# Patient Record
Sex: Male | Born: 1995 | Race: Black or African American | Hispanic: No | Marital: Single | Smoking: Never smoker
Health system: Southern US, Community
[De-identification: ages and names within clinical notes are randomized; demographics above are authoritative.]

## PROBLEM LIST (undated history)

## (undated) ENCOUNTER — Emergency Department (HOSPITAL_BASED_OUTPATIENT_CLINIC_OR_DEPARTMENT_OTHER): Admission: EM | Payer: Medicaid Other | Source: Home / Self Care

## (undated) DIAGNOSIS — J45909 Unspecified asthma, uncomplicated: Secondary | ICD-10-CM

---
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
## 2016-03-02 ENCOUNTER — Emergency Department (HOSPITAL_COMMUNITY)
Admission: EM | Admit: 2016-03-02 | Discharge: 2016-03-02 | Disposition: A | Discharge status: Home / Self Care | Payer: Medicaid Other | Attending: Emergency Medicine | Admitting: Emergency Medicine

## 2016-03-02 ENCOUNTER — Encounter (HOSPITAL_COMMUNITY): Payer: Self-pay | Admitting: Emergency Medicine

## 2016-03-02 DIAGNOSIS — Y939 Activity, unspecified: Secondary | ICD-10-CM | POA: Insufficient documentation

## 2016-03-02 DIAGNOSIS — Y999 Unspecified external cause status: Secondary | ICD-10-CM | POA: Insufficient documentation

## 2016-03-02 DIAGNOSIS — S134XXA Sprain of ligaments of cervical spine, initial encounter: Secondary | ICD-10-CM

## 2016-03-02 DIAGNOSIS — J45909 Unspecified asthma, uncomplicated: Secondary | ICD-10-CM | POA: Insufficient documentation

## 2016-03-02 DIAGNOSIS — Y92411 Interstate highway as the place of occurrence of the external cause: Secondary | ICD-10-CM | POA: Insufficient documentation

## 2016-03-02 MED ORDER — IBUPROFEN 600 MG PO TABS: 600.0000 mg | ORAL_TABLET | Freq: Four times a day (QID) | ORAL | 0 refills | Status: DC | PRN

## 2016-03-02 MED ORDER — CYCLOBENZAPRINE HCL 10 MG PO TABS: 5.0000 mg | ORAL_TABLET | Freq: Two times a day (BID) | ORAL | 0 refills | Status: DC | PRN
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
## 2018-01-11 ENCOUNTER — Emergency Department (HOSPITAL_COMMUNITY): Payer: Self-pay

## 2018-01-11 ENCOUNTER — Encounter (HOSPITAL_COMMUNITY): Payer: Self-pay | Admitting: Emergency Medicine

## 2018-01-11 ENCOUNTER — Other Ambulatory Visit: Payer: Self-pay

## 2018-01-11 ENCOUNTER — Emergency Department (HOSPITAL_COMMUNITY)
Admission: EM | Admit: 2018-01-11 | Discharge: 2018-01-11 | Disposition: A | Discharge status: Home / Self Care | Payer: Self-pay | Attending: Emergency Medicine | Admitting: Emergency Medicine

## 2018-01-11 DIAGNOSIS — J4541 Moderate persistent asthma with (acute) exacerbation: Secondary | ICD-10-CM | POA: Insufficient documentation

## 2018-01-11 LAB — I-STAT CHEM 8, ED
BUN: 10 mg/dL (ref 6–20)
CALCIUM ION: 1.15 mmol/L (ref 1.15–1.40)
CHLORIDE: 102 mmol/L (ref 98–111)
Creatinine, Ser: 1 mg/dL (ref 0.61–1.24)
GLUCOSE: 82 mg/dL (ref 70–99)
HCT: 43 % (ref 39.0–52.0)
Hemoglobin: 14.6 g/dL (ref 13.0–17.0)
Potassium: 3.3 mmol/L — ABNORMAL LOW (ref 3.5–5.1)
SODIUM: 140 mmol/L (ref 135–145)
TCO2: 28 mmol/L (ref 22–32)

## 2018-01-11 LAB — I-STAT TROPONIN, ED: TROPONIN I, POC: 0 ng/mL (ref 0.00–0.08)

## 2018-01-11 MED ORDER — IPRATROPIUM-ALBUTEROL 0.5-2.5 (3) MG/3ML IN SOLN
3.0000 mL | Freq: Once | RESPIRATORY_TRACT | Status: AC
  Administered 2018-01-11: 3 mL via RESPIRATORY_TRACT
  Filled 2018-01-11: qty 3

## 2018-01-11 MED ORDER — ALBUTEROL SULFATE HFA 108 (90 BASE) MCG/ACT IN AERS: 1.0000 | INHALATION_SPRAY | RESPIRATORY_TRACT | 0 refills | Status: DC | PRN

## 2018-01-11 MED ORDER — PREDNISONE 20 MG PO TABS: ORAL_TABLET | ORAL | 0 refills | Status: DC

## 2018-01-11 MED ORDER — ALBUTEROL SULFATE HFA 108 (90 BASE) MCG/ACT IN AERS
1.0000 | INHALATION_SPRAY | Freq: Once | RESPIRATORY_TRACT | Status: AC
  Administered 2018-01-11: 1 via RESPIRATORY_TRACT
  Filled 2018-01-11: qty 6.7

## 2018-01-11 MED ORDER — METHYLPREDNISOLONE SODIUM SUCC 125 MG IJ SOLR
125.0000 mg | Freq: Once | INTRAMUSCULAR | Status: AC
  Administered 2018-01-11: 125 mg via INTRAVENOUS
  Filled 2018-01-11: qty 2

## 2018-01-11 MED ORDER — ALBUTEROL (5 MG/ML) CONTINUOUS INHALATION SOLN
10.0000 mg/h | INHALATION_SOLUTION | Freq: Once | RESPIRATORY_TRACT | Status: AC
  Administered 2018-01-11: 10 mg/h via RESPIRATORY_TRACT
  Filled 2018-01-11: qty 20

## 2018-01-11 MED ORDER — ALBUTEROL SULFATE (2.5 MG/3ML) 0.083% IN NEBU
2.5000 mg | INHALATION_SOLUTION | Freq: Once | RESPIRATORY_TRACT | Status: AC
Start: 2018-01-11 — End: 2018-01-11
  Administered 2018-01-11: 2.5 mg via RESPIRATORY_TRACT
  Filled 2018-01-11: qty 3

## 2018-01-11 NOTE — ED Triage Notes (Signed)
Pt c/o SOB since 1300, and stuffy sinuses since yesterday.

## 2018-01-11 NOTE — Discharge Instructions (Signed)
If you develop fever, worsening shortness of breath, chest pain, vomiting, or any other new/concerning symptoms, then return to the ER for evaluation. Otherwise, use the albuterol inhaler 1-2 puffs every 4 hours  It is very important to follow up with a primary care doctor

## 2018-01-11 NOTE — ED Provider Notes (Signed)
[facility] EMERGENCY DEPARTMENT Provider Note   CSN: [id number] Arrival date & time: 01/11/18  4098     History   Chief Complaint Chief Complaint  Patient presents with  . Shortness of Breath    HPI Andrew Jacobson is a 22 y.o. male with a past medical history of childhood asthma, but not in recent years presenting with a 1 day history of shortness of breath and nasal congestion.  He reports waking last night and early this morning with increasing shortness of breath and then around 1 PM this afternoon it became increasingly worse in association with wheezing and nonproductive cough.  He denies fevers or chills.  He does endorse having a new job at [employer] which he states is very dusty which he suspects is the source of his symptoms.  He denies chest pain but does endorse tightness in his chest.  No nausea, vomiting, abdominal pain or other complaint.  He has had no medications prior to arrival.  Does not smoke.  The history is provided by the patient.    Past Medical History:  Diagnosis Date  . Asthma     There are no active problems to display for this patient.   History reviewed. No pertinent surgical history.      Home Medications    Prior to Admission medications   Medication Sig Start Date End Date Taking? Authorizing Provider  albuterol (PROVENTIL HFA;VENTOLIN HFA) 108 (90 Base) MCG/ACT inhaler Inhale 1-2 puffs into the lungs every 4 (four) hours as needed for wheezing or shortness of breath. 01/11/18   Pricilla Loveless, MD  cyclobenzaprine (FLEXERIL) 10 MG tablet Take 0.5-1 tablets (5-10 mg total) by mouth 2 (two) times daily as needed for muscle spasms. Patient not taking: Reported on 01/11/2018 03/02/16   Marlon Pel, PA-C  ibuprofen (ADVIL,MOTRIN) 600 MG tablet Take 1 tablet (600 mg total) by mouth every 6 (six) hours as needed. Patient not taking: Reported on 01/11/2018 03/02/16   Marlon Pel, PA-C  predniSONE (DELTASONE) 20 MG tablet 2 tabs  po daily x 4 days 01/11/18   Pricilla Loveless, MD    Family History History reviewed. No pertinent family history.  Social History Social History   Tobacco Use  . Smoking status: Never Smoker  . Smokeless tobacco: Never Used  Substance Use Topics  . Alcohol use: No  . Drug use: No     Allergies   Patient has no known allergies.   Review of Systems Review of Systems  Constitutional: Negative for fever.  HENT: Positive for congestion. Negative for sore throat.   Eyes: Negative.   Respiratory: Positive for chest tightness, shortness of breath and wheezing.   Cardiovascular: Negative for chest pain.  Gastrointestinal: Negative for abdominal pain, nausea and vomiting.  Genitourinary: Negative.   Musculoskeletal: Negative for arthralgias, joint swelling and neck pain.  Skin: Negative.  Negative for rash and wound.  Neurological: Negative for dizziness, weakness, light-headedness, numbness and headaches.  Psychiatric/Behavioral: Negative.      Physical Exam Updated Vital Signs BP 130/76   Pulse 98   Temp 98.8 F (37.1 C) (Oral)   Resp 20   Ht 6\' 6"  (1.981 m)   Wt 77.1 kg   SpO2 95%   BMI 19.65 kg/m   Physical Exam  Constitutional: He appears well-developed and well-nourished.  HENT:  Head: Normocephalic and atraumatic.  Eyes: Conjunctivae are normal.  Neck: Normal range of motion.  Cardiovascular: Normal rate, regular rhythm, normal heart sounds and intact distal  pulses.  Pulmonary/Chest: Accessory muscle usage present. No stridor. He has decreased breath sounds. He has wheezes.  Expiratory wheeze throughout all lung fields with prolonged expirations.  Patient is unable to speak in full sentences secondary to shortness of breath.  Abdominal: Soft. Bowel sounds are normal. There is no tenderness.  Musculoskeletal: Normal range of motion.  Neurological: He is alert.  Skin: Skin is warm and dry.  Psychiatric: He has a normal mood and affect.  Nursing note and  vitals reviewed.    ED Treatments / Results  Labs (all labs ordered are listed, but only abnormal results are displayed) Labs Reviewed  I-STAT CHEM 8, ED - Abnormal; Notable for the following components:      Result Value   Potassium 3.3 (*)    All other components within normal limits  I-STAT TROPONIN, ED    EKG EKG Interpretation  Date/Time:  Friday January 11 2018 18:32:17 EST Ventricular Rate:  101 PR Interval:    QRS Duration: 67 QT Interval:  338 QTC Calculation: 439 R Axis:   80 Text Interpretation:  Sinus tachycardia Biatrial enlargement Nonspecific T abnormalities, inferior leads No old tracing to compare Confirmed by Pricilla LovelessGoldston, Scott [phone]([id number]) on 01/11/2018 6:48:17 PM   Radiology Dg Chest 2 View  Result Date: 01/11/2018 CLINICAL DATA:  Asthma with wheezing and cough as well as shortness of breath today. EXAM: CHEST - 2 VIEW COMPARISON:  None. FINDINGS: Lungs are hyperexpanded without focal airspace consolidation or effusion. Cardiomediastinal silhouette, bones and soft tissues are normal. IMPRESSION: No active cardiopulmonary disease. Electronically Signed   By: Elberta Fortisaniel  Boyle M.D.   On: 01/11/2018 21:20    Procedures Procedures (including critical care time)  Medications Ordered in ED Medications  albuterol (PROVENTIL) (2.5 MG/3ML) 0.083% nebulizer solution 2.5 mg (2.5 mg Nebulization Given 01/11/18 1914)  ipratropium-albuterol (DUONEB) 0.5-2.5 (3) MG/3ML nebulizer solution 3 mL (3 mLs Nebulization Given 01/11/18 1914)  methylPREDNISolone sodium succinate (SOLU-MEDROL) 125 mg/2 mL injection 125 mg (125 mg Intravenous Given 01/11/18 1913)  albuterol (PROVENTIL,VENTOLIN) solution continuous neb (10 mg/hr Nebulization Given 01/11/18 2013)  albuterol (PROVENTIL HFA;VENTOLIN HFA) 108 (90 Base) MCG/ACT inhaler 1 puff (1 puff Inhalation Given 01/11/18 2200)     Initial Impression / Assessment and Plan / ED Course  I have reviewed the triage vital signs and the  nursing notes.  Pertinent labs & imaging results that were available during my care of the patient were reviewed by me and considered in my medical decision making (see chart for details).     Patient was given an albuterol and Atrovent neb treatment along with an IV dose of Solu-Medrol.  He no longer had a sensory muscle use and was able to speak in full sentences although he continued to have bilateral expiratory wheeze.  Chest x-ray was obtained which was negative for any acute infection.  He was given an hour-long neb after which his wheezing was nearly resolved.  He was ambulated in the department and he had no oxygen desaturation and he felt improved and ready to go home.  He was given a prednisone pulse dose and an albuterol MDI for home use.  Return precautions discussed.  PRN follow-up anticipated.  Final Clinical Impressions(s) / ED Diagnoses   Final diagnoses:  Moderate persistent asthma with exacerbation    ED Discharge Orders         Ordered    predniSONE (DELTASONE) 20 MG tablet     01/11/18 2202    albuterol (PROVENTIL HFA;VENTOLIN HFA)  108 (90 Base) MCG/ACT inhaler  Every 4 hours PRN     01/11/18 2202           Burgess Amor, Cordelia Poche 01/11/18 2213    Pricilla Loveless, MD 01/11/18 2330

## 2018-01-11 NOTE — ED Notes (Signed)
Discharge instructions and prescriptions discussed with Pt. Pt verbalized understanding. Pt stable and ambulatory.   

## 2018-01-11 NOTE — ED Notes (Signed)
Ambulated pt in hallway with steady gait, SpO2 stayed within normal limits. Pt's HR jumped to 140, notified Kayla(RN)

## 2018-11-22 ENCOUNTER — Other Ambulatory Visit: Payer: Self-pay | Admitting: Emergency Medicine

## 2018-11-22 DIAGNOSIS — Z20822 Contact with and (suspected) exposure to covid-19: Secondary | ICD-10-CM

## 2018-11-23 LAB — NOVEL CORONAVIRUS, NAA: SARS-CoV-2, NAA: DETECTED — AB

## 2019-06-11 IMAGING — CR DG CHEST 2V
2 series · 2 of 2 positions shown · non-contrast
Comparison: None.

CLINICAL DATA: Asthma with wheezing and cough as well as shortness
of breath today.

EXAM:
CHEST - 2 VIEW

[chest pa]
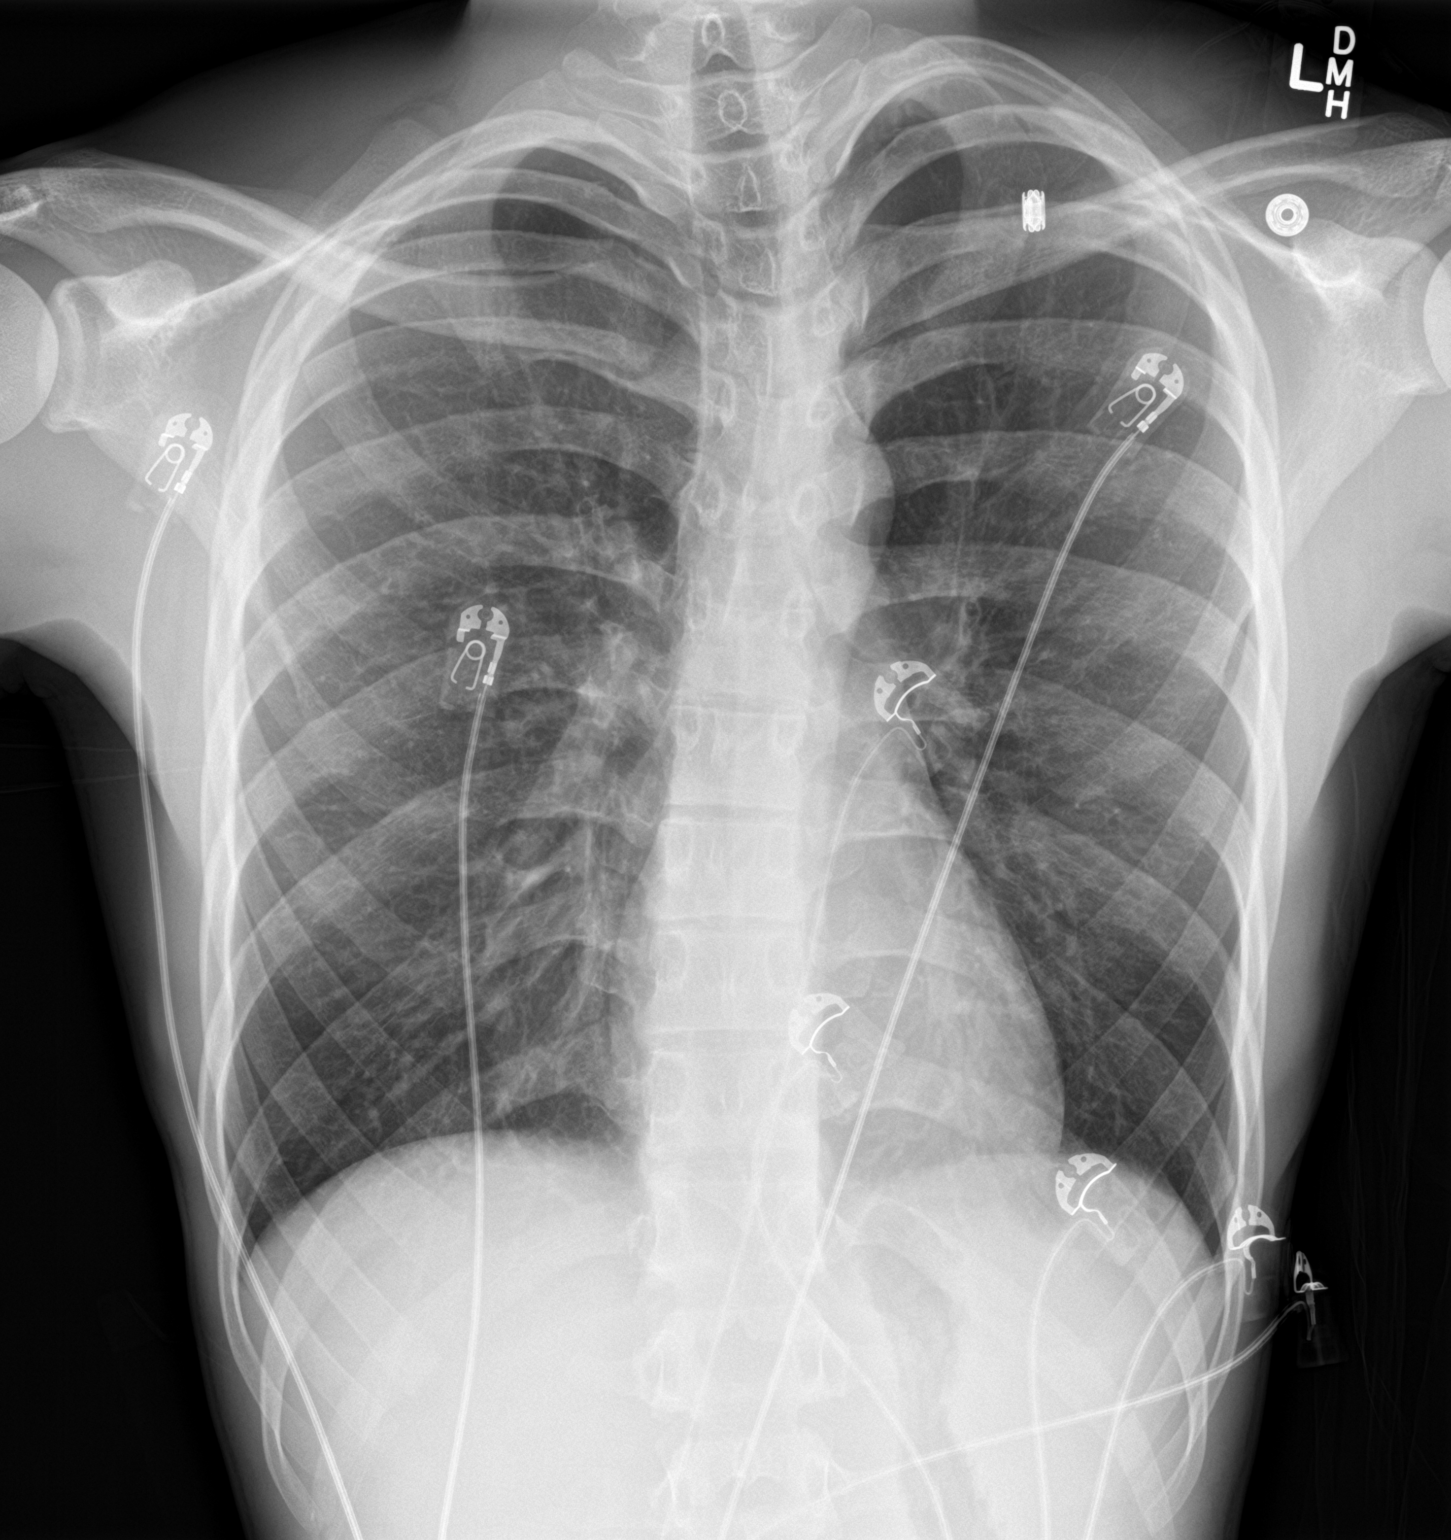

[chest lat]
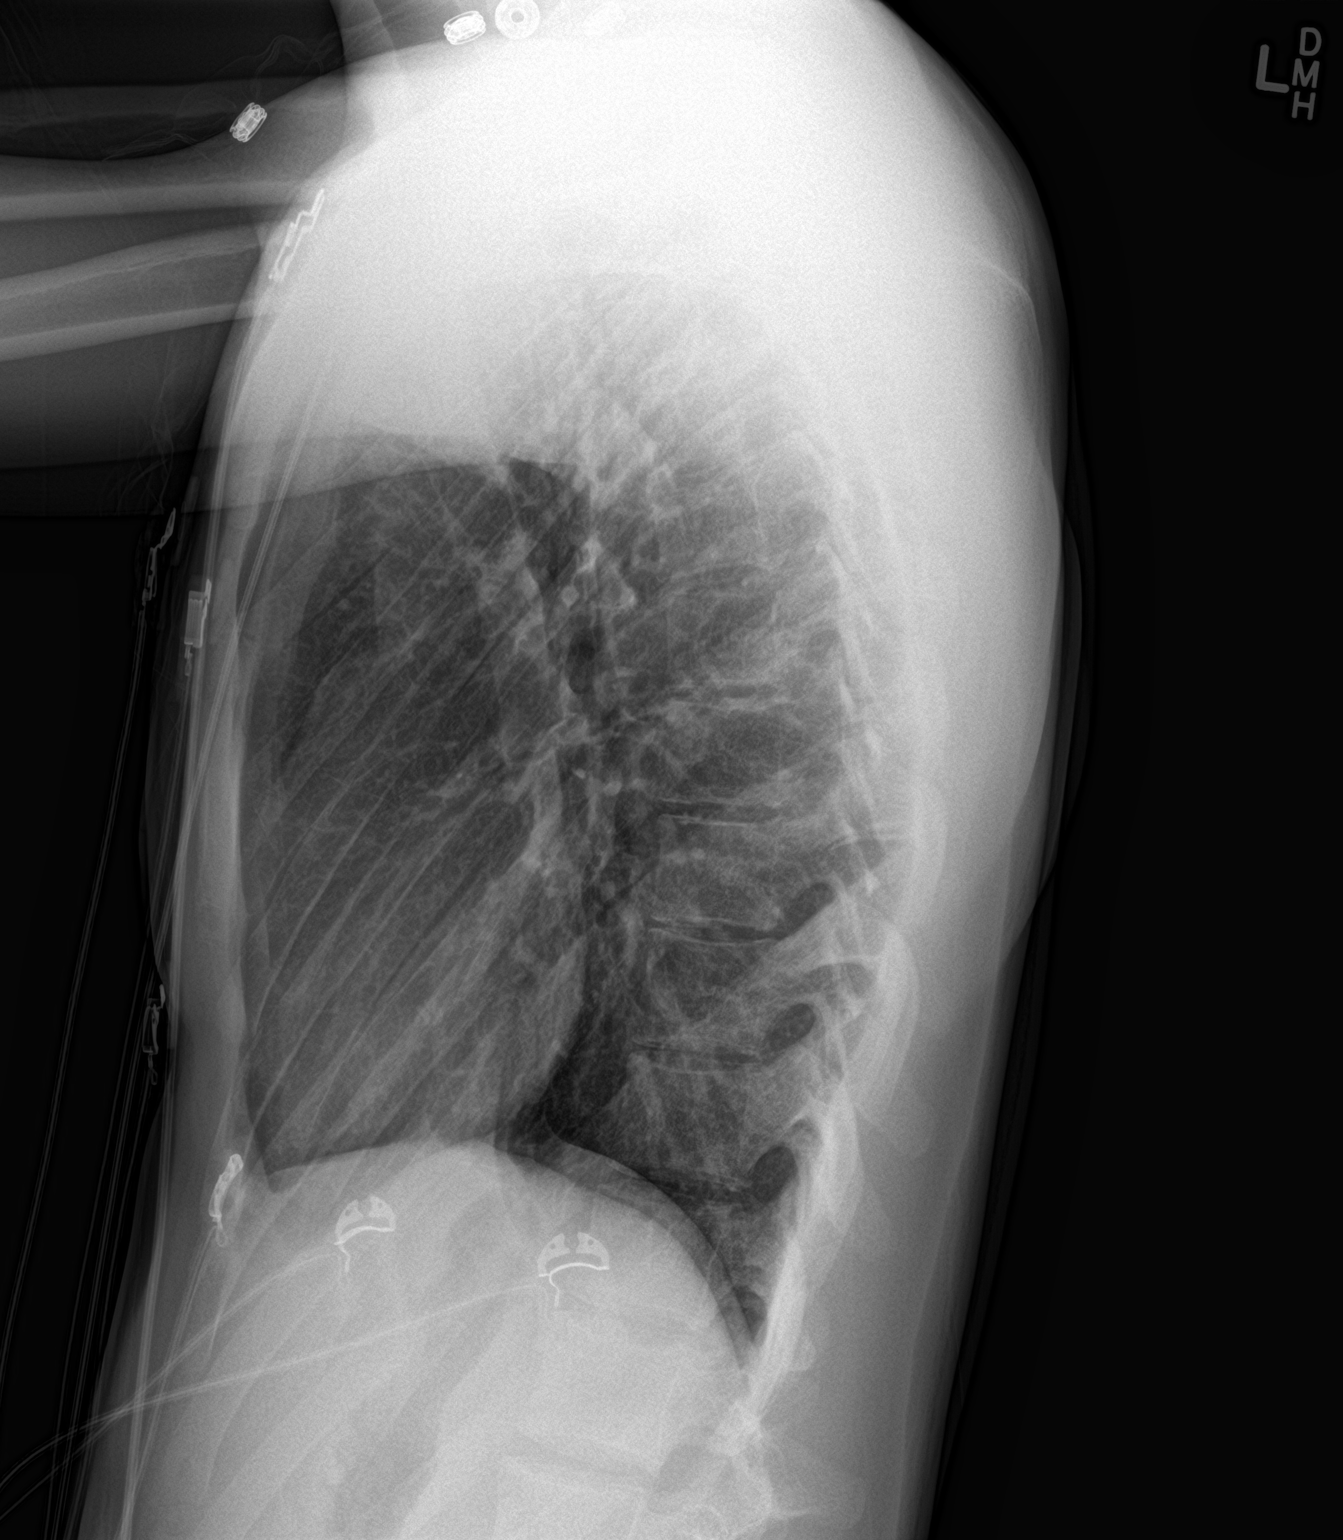

[2 of 2 positions shown; findings below may reference images not displayed]

FINDINGS: Lungs are hyperexpanded without focal airspace consolidation or
effusion. Cardiomediastinal silhouette, bones and soft tissues are
normal.
IMPRESSION: No active cardiopulmonary disease.

## 2019-08-27 ENCOUNTER — Encounter (HOSPITAL_COMMUNITY): Payer: Self-pay | Admitting: Emergency Medicine

## 2019-08-27 ENCOUNTER — Other Ambulatory Visit: Payer: Self-pay

## 2019-08-27 ENCOUNTER — Emergency Department (HOSPITAL_COMMUNITY)
Admission: EM | Admit: 2019-08-27 | Discharge: 2019-08-27 | Disposition: A | Discharge status: Home / Self Care | Payer: Medicaid Other | Attending: Emergency Medicine | Admitting: Emergency Medicine

## 2019-08-27 ENCOUNTER — Encounter (HOSPITAL_COMMUNITY): Payer: Self-pay

## 2019-08-27 DIAGNOSIS — K0889 Other specified disorders of teeth and supporting structures: Secondary | ICD-10-CM | POA: Insufficient documentation

## 2019-08-27 DIAGNOSIS — Z5321 Procedure and treatment not carried out due to patient leaving prior to being seen by health care provider: Secondary | ICD-10-CM | POA: Insufficient documentation

## 2019-08-27 DIAGNOSIS — J45909 Unspecified asthma, uncomplicated: Secondary | ICD-10-CM | POA: Insufficient documentation

## 2019-08-27 DIAGNOSIS — Z7951 Long term (current) use of inhaled steroids: Secondary | ICD-10-CM | POA: Insufficient documentation

## 2019-08-27 MED ORDER — AMOXICILLIN-POT CLAVULANATE 875-125 MG PO TABS
1.0000 | ORAL_TABLET | Freq: Once | ORAL | Status: AC
  Administered 2019-08-27: 20:00:00 1 via ORAL
  Filled 2019-08-27: qty 1

## 2019-08-27 MED ORDER — NAPROXEN 500 MG PO TABS: 500.0000 mg | ORAL_TABLET | Freq: Two times a day (BID) | ORAL | 0 refills | Status: AC

## 2019-08-27 MED ORDER — NAPROXEN 500 MG PO TABS
500.0000 mg | ORAL_TABLET | Freq: Once | ORAL | Status: AC
  Administered 2019-08-27: 20:00:00 500 mg via ORAL
  Filled 2019-08-27: qty 1

## 2019-08-27 MED ORDER — AMOXICILLIN-POT CLAVULANATE 875-125 MG PO TABS: 1.0000 | ORAL_TABLET | Freq: Two times a day (BID) | ORAL | 0 refills | Status: DC

## 2019-08-27 NOTE — ED Triage Notes (Signed)
Patient arrived stating he has had left lower dental pain since Monday. Reports taking Advil with no relief, last dose about 2 hours ago. No swelling noted or difficulty swallowing.

## 2019-08-27 NOTE — ED Triage Notes (Signed)
24 yo pt c/o lower left molar pain since Monday. Pt states he has taken ibuprofen but no relief

## 2019-08-27 NOTE — ED Provider Notes (Signed)
[facility]   CSN: [id number] Arrival date & time: 08/27/19  1844     History Chief Complaint  Patient presents with  . Dental Pain    Andrew Jacobson is a 24 y.o. male with past medical history significant for asthma.  HPI Patient presents to the emergency room today with chief complaint of progressively worsening left lower dental pain x2 days.  Patient states his left lower molar broke approximately 1 month ago.  He has not had any problems with it until now.  He is describing a throbbing pain localized to the tooth.  He has tried using Orajel and taking ibuprofen at home without any symptom relief. Last dose was yesterday.  He has not seen a dentist since he was a child. Denies fever, chills, voice change, inability to control secretions, nausea/vomiting, facial swelling, dysphagia, odynophagia, drainage or trauma.     Past Medical History:  Diagnosis Date  . Asthma     There are no problems to display for this patient.   No past surgical history on file.     No family history on file.  Social History   Tobacco Use  . Smoking status: Never Smoker  . Smokeless tobacco: Never Used  Substance Use Topics  . Alcohol use: No  . Drug use: No    Home Medications Prior to Admission medications   Medication Sig Start Date End Date Taking? Authorizing Provider  albuterol (PROVENTIL HFA;VENTOLIN HFA) 108 (90 Base) MCG/ACT inhaler Inhale 1-2 puffs into the lungs every 4 (four) hours as needed for wheezing or shortness of breath. 01/11/18   Pricilla Loveless, MD  amoxicillin-clavulanate (AUGMENTIN) 875-125 MG tablet Take 1 tablet by mouth every 12 (twelve) hours. 08/27/19   Ayeden Gladman E, PA-C  cyclobenzaprine (FLEXERIL) 10 MG tablet Take 0.5-1 tablets (5-10 mg total) by mouth 2 (two) times daily as needed for muscle spasms. Patient not taking: Reported on 01/11/2018 03/02/16   Marlon Pel, PA-C  ibuprofen (ADVIL,MOTRIN)  600 MG tablet Take 1 tablet (600 mg total) by mouth every 6 (six) hours as needed. Patient not taking: Reported on 01/11/2018 03/02/16   Marlon Pel, PA-C  naproxen (NAPROSYN) 500 MG tablet Take 1 tablet (500 mg total) by mouth 2 (two) times daily with a meal for 7 days. 08/27/19 09/03/19  Alexandr Yaworski, Caroleen Hamman, PA-C  predniSONE (DELTASONE) 20 MG tablet 2 tabs po daily x 4 days 01/11/18   Pricilla Loveless, MD    Allergies    Patient has no known allergies.  Review of Systems   Review of Systems All other systems are reviewed and are negative for acute change except as noted in the HPI.  Physical Exam Updated Vital Signs BP 140/80 (BP Location: Left Arm)   Pulse 74   Temp 99.8 F (37.7 C) (Oral)   Resp 16   Ht 6\' 6"  (1.981 m)   Wt 72.6 kg   SpO2 100%   BMI 18.49 kg/m   Physical Exam Vitals and nursing note reviewed.  Constitutional:      General: He is not in acute distress.    Appearance: He is not toxic-appearing.  HENT:     Head: Normocephalic and atraumatic.     Nose: Nose normal.     Mouth/Throat:      Comments: Chronic dental decay. No noted area of swelling or fluctuance. No trismus. Mouth opening to at least 3 finger widths. Handles oral secretions without difficulty. External exam shows no asymmetry of  the jaw line or face, no signs of obvious swelling or infection. No swelling or tenderness to the submental or submandibular regions. No swelling or tenderness into the soft tissues of the neck.Full active range of motion of the jaw. Neck is supple with full active range of motion, no tenderness to palpation of the soft tissues.    Eyes:     General: No scleral icterus.       Right eye: No discharge.        Left eye: No discharge.     Conjunctiva/sclera: Conjunctivae normal.  Cardiovascular:     Rate and Rhythm: Normal rate and regular rhythm.     Pulses: Normal pulses.     Heart sounds: Normal heart sounds.  Pulmonary:     Effort: Pulmonary effort is  normal.     Breath sounds: Normal breath sounds.  Abdominal:     General: There is no distension.  Musculoskeletal:        General: Normal range of motion.     Cervical back: Normal range of motion. No muscular tenderness.  Skin:    General: Skin is warm and dry.  Neurological:     Mental Status: He is oriented to person, place, and time.  Psychiatric:        Behavior: Behavior normal.     ED Results / Procedures / Treatments   Labs (all labs ordered are listed, but only abnormal results are displayed) Labs Reviewed - No data to display  EKG None  Radiology No results found.  Procedures Procedures (including critical care time)  Medications Ordered in ED Medications  amoxicillin-clavulanate (AUGMENTIN) 875-125 MG per tablet 1 tablet (has no administration in time range)  naproxen (NAPROSYN) tablet 500 mg (has no administration in time range)    ED Course  I have reviewed the triage vital signs and the nursing notes.  Pertinent labs & imaging results that were available during my care of the patient were reviewed by me and considered in my medical decision making (see chart for details).    MDM Rules/Calculators/A&P                          History provided by patient with additional history obtained from chart review.    Pt is well appearing and is presenting with dental pain. Differential Diagnosis includes but is not limited to: toothache, abscess, periapical abscess, dental caries, cracked tooth, maxillary sinusitis, gingivitis, gum hyperplasia, tooth fracture, tooth dislocation. On exam patient has chronic dental decay. Patient with toothache.  No gross abscess.  Exam unconcerning for Ludwig's angina or spread of infection.  Will treat with Augmentin and anti-inflammatories medicine.  Urged patient to follow-up with dentist and given dental resource list. VSS.  Pt appears stable for d/c. Strict return precautions discussed.   Portions of this note were generated  with Scientist, clinical (histocompatibility and immunogenetics). Dictation errors may occur despite best attempts at proofreading.    Final Clinical Impression(s) / ED Diagnoses Final diagnoses:  Pain, dental    Rx / DC Orders ED Discharge Orders         Ordered    amoxicillin-clavulanate (AUGMENTIN) 875-125 MG tablet  Every 12 hours     Discontinue  Reprint     08/27/19 1926    naproxen (NAPROSYN) 500 MG tablet  2 times daily with meals     Discontinue  Reprint     08/27/19 1926  Sherene Sires, PA-C 08/27/19 1937    Vanetta Mulders, MD 08/28/19 [phone]

## 2019-08-27 NOTE — Discharge Instructions (Addendum)
Today you were seen for dental pain.  If you start to experience and new or worsening symptoms return to the emergency department. If you start to experience fever, chills, neck stiffness/pain, or inability to move your neck or open your mouth come back to the emergency department immediately. If you begin to experience any blistering, rashes, swelling, or difficulty breathing seek medical care for evaluation of potentially more serious side effects.  Medications:  -I have prescribed you an antibiotic  Augmentin to treat the infection and Naproxen which is an anti-inflammatory medicine to treat the pain.  -Swish with Listerine.  You can also gargle warm salt water up to 5 times daily.  Both of these rinses can help to remove bacteria from the mouth.  You can also use viscous lidocaine every 3 hours to help numb the area.  You can apply a cool compress for 15 to 20 minutes, but avoid applying heat to the area. -Continue usual home medications.  2. Follow Up: Follow up with the on call dentist Dr. Katrinka Blazing. Call his office to schedule the next available follow up appointment  Use the resource guide listed below to help you find a dentist if you do not already have one to follow up with if needed. It is very important that you get evaluated by a dentist as soon as possible. Call tomorrow to schedule an appointment. Read the instructions below.     Be sure to eat something when taking the Naproxen as it can cause stomach upset and at worst stomach bleeding. Do not take additional non steroidal anti-inflammatory medicines such as Ibuprofen, Aleve, Advil, Mobic, Diclofenac, or goodie powder while taking Naproxen. You may supplement with Tylenol.    Dental Care: Organization         Address  Phone  Notes  [facility] [address] [place], [place] [phone] Accepts children up to age 70 who are enrolled in [place] or [place] Health Choice;  pregnant women with a Medicaid card; and children who have applied for Medicaid or [place] Health Choice, but were declined, whose parents can pay a reduced fee at time of service.  [facility]  [address], [place] [phone] Accepts children up to age 70 who are enrolled in [place] or [place] Health Choice; pregnant women with a Medicaid card; and children who have applied for Medicaid or [place] Health Choice, but were declined, whose parents can pay a reduced fee at time of service.  [facility]  [address] [place], [place] [phone] Patients are seen by appointment only. Walk-ins are not accepted. [facility] will see patients 50 years of age and older. Monday - Tuesday (8am-5pm) Most Wednesdays (8:30-5pm) $30 per visit, cash only  [facility]  [address], [facility] [phone] Patients are seen by appointment only. Walk-ins are not accepted. [facility] will see patients 42 years of age and older. One Wednesday Evening (Monthly: Volunteer Based).  $30 per visit, cash only  [employer] [facility] [employer]  [phone] for adults; Children under age 21, call Graduate Pediatric Dentistry at [phone]. Children aged 84-14, please call [phone] to request a pediatric application.  Dental services are provided in all areas of dental care including fillings, crowns and bridges, complete and partial dentures, implants, gum treatment, root canals, and extractions. Preventive care is also provided. Treatment  is provided to both adults and children. Patients are selected via a lottery and there is often a waiting list.   [facility] [address], [place]  [phone] [email or web address]   [facility] [address] [place], [place] [phone], Ext. [phone] Second and Fourth Thursday of each month, opens at 6:30 AM; Clinic ends at 9 AM.   Patients are seen on a first-come first-served basis, and a limited number are seen during each clinic.   [facility]  [address] [place], [place] [phone]   Eligibility Requirements You must have lived in [place], [place], or [place] counties for at least the last three months.   You cannot be eligible for state or federal sponsored [place], including [employer], [place], or [employer].   You generally cannot be eligible for healthcare insurance through your employer.    How to apply: Eligibility screenings are held every Tuesday and Wednesday afternoon from 1:00 pm until 4:00 pm. You do not need an appointment for the interview!  [facility] [address] [place], [place] [phone]   [facility]  [phone]   [facility]  [phone]   [facility]  [phone]    RESOURCE GUIDE:  Dental Problems  Patients with Medicaid: [facility] [phone]                                [phone] [address] [employer] Phone:  [phone]                                                  Phone:  [phone]  If unable to pay or uninsured, contact:  [facility] or [facility]. to become qualified for the adult dental clinic.  Insufficient Money for Medicine Contact [employer]:  call "211" or [facility] 5036754598.  No Primary Care Doctor Call [facility]  [phone] Other agencies that provide inexpensive medical care    [facility]  [phone]    [facility]  [phone]    [facility]  [phone]    [facility]  [phone]    [facility]  [phone]    [facility]  [phone]

## 2021-05-20 ENCOUNTER — Emergency Department (HOSPITAL_COMMUNITY)
Admission: EM | Admit: 2021-05-20 | Discharge: 2021-05-20 | Discharge status: Against Medical Advice | Payer: Medicaid Other | Attending: Emergency Medicine | Admitting: Emergency Medicine

## 2021-05-20 ENCOUNTER — Encounter (HOSPITAL_COMMUNITY): Payer: Self-pay | Admitting: Emergency Medicine

## 2021-05-20 DIAGNOSIS — Z0000 Encounter for general adult medical examination without abnormal findings: Secondary | ICD-10-CM | POA: Insufficient documentation

## 2021-05-20 DIAGNOSIS — Z5321 Procedure and treatment not carried out due to patient leaving prior to being seen by health care provider: Secondary | ICD-10-CM | POA: Insufficient documentation

## 2021-05-20 NOTE — ED Triage Notes (Signed)
Patient here after being around a coworker three days ago that is now out of work due to illness. Patient has not spoken to coworker and is unsure of his illness. Patient states he personally has no complaints but wanted to come to the hospital to make sure he did not get sick. ?

## 2022-02-24 ENCOUNTER — Other Ambulatory Visit: Payer: Self-pay

## 2022-02-24 ENCOUNTER — Ambulatory Visit
Admission: EM | Admit: 2022-02-24 | Discharge: 2022-02-24 | Disposition: A | Discharge status: Home / Self Care | Payer: Medicaid Other | Attending: Internal Medicine | Admitting: Internal Medicine

## 2022-02-24 ENCOUNTER — Ambulatory Visit (HOSPITAL_BASED_OUTPATIENT_CLINIC_OR_DEPARTMENT_OTHER)
Admission: RE | Admit: 2022-02-24 | Discharge: 2022-02-24 | Disposition: A | Discharge status: Home / Self Care | Payer: Medicaid Other | Source: Ambulatory Visit | Attending: Emergency Medicine | Admitting: Emergency Medicine

## 2022-02-24 DIAGNOSIS — J189 Pneumonia, unspecified organism: Secondary | ICD-10-CM

## 2022-02-24 DIAGNOSIS — M419 Scoliosis, unspecified: Secondary | ICD-10-CM | POA: Diagnosis not present

## 2022-02-24 DIAGNOSIS — R509 Fever, unspecified: Secondary | ICD-10-CM | POA: Insufficient documentation

## 2022-02-24 DIAGNOSIS — J3089 Other allergic rhinitis: Secondary | ICD-10-CM | POA: Diagnosis not present

## 2022-02-24 DIAGNOSIS — J302 Other seasonal allergic rhinitis: Secondary | ICD-10-CM

## 2022-02-24 DIAGNOSIS — R059 Cough, unspecified: Secondary | ICD-10-CM | POA: Diagnosis not present

## 2022-02-24 DIAGNOSIS — R0989 Other specified symptoms and signs involving the circulatory and respiratory systems: Secondary | ICD-10-CM | POA: Diagnosis not present

## 2022-02-24 MED ORDER — PROMETHAZINE-DM 6.25-15 MG/5ML PO SYRP: 5.0000 mL | ORAL_SOLUTION | Freq: Every evening | ORAL | 0 refills | Status: AC | PRN

## 2022-02-24 MED ORDER — FLUTICASONE PROPIONATE 50 MCG/ACT NA SUSP: 1.0000 | Freq: Every day | NASAL | 1 refills | Status: AC

## 2022-02-24 MED ORDER — GUAIFENESIN 400 MG PO TABS: ORAL_TABLET | ORAL | 0 refills | Status: AC

## 2022-02-24 MED ORDER — AMOXICILLIN-POT CLAVULANATE 875-125 MG PO TABS: 1.0000 | ORAL_TABLET | Freq: Two times a day (BID) | ORAL | 0 refills | Status: AC

## 2022-02-24 MED ORDER — VENTOLIN HFA 108 (90 BASE) MCG/ACT IN AERS: 2.0000 | INHALATION_SPRAY | RESPIRATORY_TRACT | 2 refills | Status: AC | PRN

## 2022-02-24 MED ORDER — CETIRIZINE HCL 10 MG PO TABS: 10.0000 mg | ORAL_TABLET | Freq: Every day | ORAL | 1 refills | Status: AC

## 2022-02-24 MED ORDER — ACETAMINOPHEN 325 MG PO TABS
650.0000 mg | ORAL_TABLET | Freq: Once | ORAL | Status: AC
  Administered 2022-02-24: 650 mg via ORAL

## 2022-02-24 MED ORDER — ALBUTEROL SULFATE HFA 108 (90 BASE) MCG/ACT IN AERS
2.0000 | INHALATION_SPRAY | Freq: Once | RESPIRATORY_TRACT | Status: AC
  Administered 2022-02-24: 2 via RESPIRATORY_TRACT

## 2022-02-24 MED ORDER — AZITHROMYCIN 250 MG PO TABS: ORAL_TABLET | ORAL | 0 refills | Status: AC

## 2022-02-24 NOTE — Discharge Instructions (Addendum)
Please go to [facility] at [facility] to have an x-ray done of your chest.  Please go to the main entrance of the emergency room and let them know that you were seen here at urgent care and that you have an x-ray ordered.  They attempted to admit you to the emergency room or tell you to sit and wait in the lobby with other patients, please call us back here at the number listed on your checkout sheet, [phone] and we will speak with them to get you in quickly.  Once we receive the results of her chest x-ray, we we will place them to your MyChart account.  If there are any findings that are different and what I anticipate, I will contact you by phone.   At this time, based on my physical exam findings and the history you have provided to me today, I believe that you have pneumonia in your right lower lung field.  I recommend antibiotics and supportive medications at this time.  Please read below to learn more about the medications, dosages and frequencies that I recommend to help alleviate your symptoms and to get you feeling better soon:   Augmentin (amoxicillin - clavulanic acid): This is an antibiotic for pneumonia.  Please take 1 tablet twice daily for 5 days, you can take it with or without food.  This antibiotic can cause upset stomach, this will resolve once antibiotics are complete.  You are welcome to use a probiotic, eat yogurt, take Imodium while taking this medication.  Please avoid other systemic medications such as Maalox, Pepto-Bismol or milk of magnesia as they can interfere with your body's ability to absorb the antibiotics.  Z-Pak (azithromycin): This is a second metabolic for pneumonia, please take 2 tablets the first day, then take 1 tablet daily every day thereafter until complete.   Zyrtec (cetirizine): This is an excellent second-generation antihistamine that helps to reduce respiratory inflammatory response to environmental allergens.  In some patients,  this medication can cause daytime sleepiness so I recommend that you take 1 tablet daily at bedtime.     Flonase (fluticasone): This is a steroid nasal spray that you use once daily, 1 spray in each nare.  This medication does not work well if you decide to use it only used as you feel you need to, it works best used on a daily basis.  After 3 to 5 days of use, you will notice significant reduction of the inflammation and mucus production that is currently being caused by exposure to allergens, whether seasonal or environmental.  The most common side effect of this medication is nosebleeds.  If you experience a nosebleed, please discontinue use for 1 week, then feel free to resume.     ProAir, Ventolin, Proventil (albuterol): This inhaled medication contains a short acting beta agonist bronchodilator.  This medication works on the smooth muscle that opens and constricts of your airways by relaxing the muscle.  The result of relaxation of the smooth muscle is increased air movement and improved work of breathing.  This is a short acting medication that can be used every 4-6 hours as needed for increased work of breathing, shortness of breath, wheezing and excessive coughing.  I have provided you with a prescription in addition to a sample to use until you can pick up your prescription.    Advil, Motrin (ibuprofen): This is a good anti-inflammatory medication which not only addresses aches, pains but also significantly reduces soft tissue inflammation  of the upper airways that causes sinus and nasal congestion as well as inflammation of the lower airways which makes you feel like your breathing is constricted or your cough feel tight.  I recommend that you take 400 mg every 8 hours as needed.      Robitussin, Mucinex (guaifenesin): This is an expectorant.  This helps break up chest congestion and loosen up thick nasal drainage making phlegm and drainage more liquid and therefore easier to remove.  I recommend  being 400 mg three times daily as needed.      Promethazine DM: Promethazine is both a nasal decongestant and an antinausea medication that makes most patients feel fairly sleepy.  The DM is dextromethorphan, a cough suppressant found in many over-the-counter cough medications.  Please take 5 mL before bedtime to minimize your cough which will help you sleep better.     Please follow-up within the next 5-7 days either with your primary care provider or urgent care if your symptoms do not resolve.  If you do not have a primary care provider, we will assist you in finding one.        Thank you for visiting urgent care today.  We appreciate the opportunity to participate in your care.

## 2022-02-24 NOTE — ED Provider Notes (Signed)
[facility]    CSN: [id number] Arrival date & time: 02/24/22  1619    HISTORY   Chief Complaint  Patient presents with   Fever   Cough   Chills   HPI Andrew Jacobson is a pleasant, 27 y.o. male who presents to urgent care today. Patient complains of a 3-day history of fever, chills, nonproductive cough and weakness.  Patient has a temperature of 101.8 on arrival.  Patient states has been taking Motrin which provides intermittent relief of symptoms.  Patient has a known history of asthma, does not have any current options for allergy or asthma medications.    Past Medical History:  Diagnosis Date   Asthma    There are no problems to display for this patient.  History reviewed. No pertinent surgical history.  Home Medications    Prior to Admission medications   Medication Sig Start Date End Date Taking? Authorizing Provider  albuterol (PROVENTIL HFA;VENTOLIN HFA) 108 (90 Base) MCG/ACT inhaler Inhale 1-2 puffs into the lungs every 4 (four) hours as needed for wheezing or shortness of breath. 01/11/18   Sherwood Gambler, MD  amoxicillin-clavulanate (AUGMENTIN) 875-125 MG tablet Take 1 tablet by mouth every 12 (twelve) hours. 08/27/19   Walisiewicz, Harley Hallmark, PA-C  cyclobenzaprine (FLEXERIL) 10 MG tablet Take 0.5-1 tablets (5-10 mg total) by mouth 2 (two) times daily as needed for muscle spasms. Patient not taking: Reported on 01/11/2018 03/02/16   Delos Haring, PA-C  ibuprofen (ADVIL,MOTRIN) 600 MG tablet Take 1 tablet (600 mg total) by mouth every 6 (six) hours as needed. Patient not taking: Reported on 01/11/2018 03/02/16   Delos Haring, PA-C  predniSONE (DELTASONE) 20 MG tablet 2 tabs po daily x 4 days 01/11/18   Sherwood Gambler, MD    Family History History reviewed. No pertinent family history. Social History Social History   Tobacco Use   Smoking status: Never   Smokeless tobacco: Never  Substance Use Topics   Alcohol use: No   Drug use: No    Allergies   Patient has no known allergies.  Review of Systems Review of Systems Pertinent findings revealed after performing a 14 point review of systems has been noted in the history of present illness.  Physical Exam Triage Vital Signs ED Triage Vitals  Enc Vitals Group     BP 12/10/20 0827 (!) 147/82     Pulse Rate 12/10/20 0827 72     Resp 12/10/20 0827 18     Temp 12/10/20 0827 98.3 F (36.8 C)     Temp Source 12/10/20 0827 Oral     SpO2 12/10/20 0827 98 %     Weight --      Height --      Head Circumference --      Peak Flow --      Pain Score 12/10/20 0826 5     Pain Loc --      Pain Edu? --      Excl. in [place]? --   No data found.  Updated Vital Signs BP 128/75 (BP Location: Right Arm)   Pulse 80   Temp (!) 101.8 F (38.8 C) (Oral)   Resp 16   SpO2 96%   Physical Exam Vitals and nursing note reviewed.  Constitutional:      General: He is awake. He is not in acute distress.    Appearance: Normal appearance. He is well-developed, well-groomed and normal weight. He is ill-appearing. He is not toxic-appearing or diaphoretic.  HENT:  Head: Normocephalic and atraumatic.     Salivary Glands: Right salivary gland is not diffusely enlarged or tender. Left salivary gland is not diffusely enlarged or tender.     Right Ear: Ear canal and external ear normal. No drainage. A middle ear effusion is present. There is no impacted cerumen. Tympanic membrane is bulging. Tympanic membrane is not injected or erythematous.     Left Ear: Ear canal and external ear normal. No drainage. A middle ear effusion is present. There is no impacted cerumen. Tympanic membrane is bulging. Tympanic membrane is not injected or erythematous.     Ears:     Comments: Bilateral EACs normal, both TMs bulging with clear fluid    Nose: Rhinorrhea present. No nasal deformity, septal deviation, signs of injury, nasal tenderness, mucosal edema or congestion. Rhinorrhea is clear.     Right Nostril:  Occlusion present. No foreign body, epistaxis or septal hematoma.     Left Nostril: Occlusion present. No foreign body, epistaxis or septal hematoma.     Right Turbinates: Enlarged, swollen and pale.     Left Turbinates: Enlarged, swollen and pale.     Right Sinus: No maxillary sinus tenderness or frontal sinus tenderness.     Left Sinus: No maxillary sinus tenderness or frontal sinus tenderness.     Mouth/Throat:     Lips: Pink. No lesions.     Mouth: Mucous membranes are moist. No oral lesions.     Pharynx: Oropharynx is clear. Uvula midline. No posterior oropharyngeal erythema or uvula swelling.     Tonsils: No tonsillar exudate. 0 on the right. 0 on the left.     Comments: Postnasal drip Eyes:     General: Lids are normal.        Right eye: No discharge.        Left eye: No discharge.     Extraocular Movements: Extraocular movements intact.     Conjunctiva/sclera: Conjunctivae normal.     Right eye: Right conjunctiva is not injected.     Left eye: Left conjunctiva is not injected.  Neck:     Trachea: Trachea and phonation normal.  Cardiovascular:     Rate and Rhythm: Normal rate and regular rhythm.     Pulses: Normal pulses.     Heart sounds: Normal heart sounds. No murmur heard.    No friction rub. No gallop.  Pulmonary:     Effort: Pulmonary effort is normal. No tachypnea, bradypnea, accessory muscle usage, prolonged expiration, respiratory distress or retractions.     Breath sounds: No stridor, decreased air movement or transmitted upper airway sounds. Examination of the right-middle field reveals rales. Examination of the right-lower field reveals rhonchi and rales. Rhonchi and rales present. No decreased breath sounds or wheezing.  Chest:     Chest wall: No tenderness.  Musculoskeletal:        General: Normal range of motion.     Cervical back: Full passive range of motion without pain, normal range of motion and neck supple. Normal range of motion.  Lymphadenopathy:      Cervical: Cervical adenopathy present.     Right cervical: Superficial cervical adenopathy and posterior cervical adenopathy present.     Left cervical: Superficial cervical adenopathy and posterior cervical adenopathy present.  Skin:    General: Skin is warm and dry.     Findings: No erythema or rash.  Neurological:     General: No focal deficit present.     Mental Status: He is alert and  oriented to person, place, and time.  Psychiatric:        Mood and Affect: Mood normal.        Behavior: Behavior normal. Behavior is cooperative.     Visual Acuity Right Eye Distance:   Left Eye Distance:   Bilateral Distance:    Right Eye Near:   Left Eye Near:    Bilateral Near:     UC Couse / Diagnostics / Procedures:     Radiology No results found.  Procedures Procedures (including critical care time) EKG  Pending results:  Labs Reviewed - No data to display  Medications Ordered in UC: Medications  acetaminophen (TYLENOL) tablet 650 mg (650 mg Oral Given 02/24/22 1742)  albuterol (VENTOLIN HFA) 108 (90 Base) MCG/ACT inhaler 2 puff (2 puffs Inhalation Given 02/24/22 1824)    UC Diagnoses / Final Clinical Impressions(s)   I have reviewed the triage vital signs and the nursing notes.  Pertinent labs & imaging results that were available during my care of the patient were reviewed by me and considered in my medical decision making (see chart for details).    Final diagnoses:  Pneumonia of right lung due to infectious organism, unspecified part of lung  Perennial allergic rhinitis with seasonal variation   Based on physical exam findings and patient's persistent fever, patient seems to be suffering from pneumonia in either his right middle or lower lobe.  Will treat patient empirically for presumed pneumonia with Augmentin and azithromycin.  Patient was encouraged to go to [facility] to have functioning chest x-ray done since we do not have an x-ray machine at this  location at this time.  Patient provided with supportive medications as well.  Patient provided with albuterol HFA during his visit today and reported good improvement of work of breathing.  Recommend allergy medications as well. Please see discharge instructions below for further details of plan of care as provided to patient. ED Prescriptions     Medication Sig Dispense Auth. Provider   amoxicillin-clavulanate (AUGMENTIN) 875-125 MG tablet Take 1 tablet by mouth 2 (two) times daily for 5 days. 10 tablet Theadora Rama Scales, PA-C   azithromycin (ZITHROMAX) 250 MG tablet Take 2 tablets (500 mg total) by mouth daily for 1 day, THEN 1 tablet (250 mg total) daily for 4 days. 6 tablet Theadora Rama Scales, PA-C   guaifenesin (HUMIBID E) 400 MG TABS tablet Take 1 tablet 3 times daily as needed for chest congestion and cough 30 tablet Theadora Rama Scales, PA-C   promethazine-dextromethorphan (PROMETHAZINE-DM) 6.25-15 MG/5ML syrup Take 5 mLs by mouth at bedtime as needed for cough. 60 mL Theadora Rama Scales, PA-C   albuterol (VENTOLIN HFA) 108 (90 Base) MCG/ACT inhaler Inhale 2 puffs into the lungs every 4 (four) hours as needed for wheezing or shortness of breath (excessive coughing). 36 g Theadora Rama Scales, PA-C   cetirizine (ZYRTEC ALLERGY) 10 MG tablet Take 1 tablet (10 mg total) by mouth at bedtime. 90 tablet Theadora Rama Scales, PA-C   fluticasone (FLONASE) 50 MCG/ACT nasal spray Place 1 spray into both nostrils daily. 47.4 mL Theadora Rama Scales, PA-C      PDMP not reviewed this encounter.  Disposition Upon Discharge:  Condition: stable for discharge home Home: take medications as prescribed; routine discharge instructions as discussed; follow up as advised.  Patient presented with an acute illness with associated systemic symptoms and significant discomfort requiring urgent management. In my opinion, this is a condition that a prudent lay person (someone  who possesses an  average knowledge of health and medicine) may potentially expect to result in complications if not addressed urgently such as respiratory distress, impairment of bodily function or dysfunction of bodily organs.   Routine symptom specific, illness specific and/or disease specific instructions were discussed with the patient and/or caregiver at length.   As such, the patient has been evaluated and assessed, work-up was performed and treatment was provided in alignment with urgent care protocols and evidence based medicine.  Patient/parent/caregiver has been advised that the patient may require follow up for further testing and treatment if the symptoms continue in spite of treatment, as clinically indicated and appropriate.  If the patient was tested for COVID-19, Influenza and/or RSV, then the patient/parent/guardian was advised to isolate at home pending the results of his/her diagnostic coronavirus test and potentially longer if they're positive. I have also advised pt that if his/her COVID-19 test returns positive, it's recommended to self-isolate for at least 10 days after symptoms first appeared AND until fever-free for 24 hours without fever reducer AND other symptoms have improved or resolved. Discussed self-isolation recommendations as well as instructions for household member/close contacts as per the [facility] and [place] DHHS, and also gave patient the COVID packet with this information.  Patient/parent/caregiver has been advised to return to the [facility] or PCP in 3-5 days if no better; to PCP or the Emergency Department if new signs and symptoms develop, or if the current signs or symptoms continue to change or worsen for further workup, evaluation and treatment as clinically indicated and appropriate  The patient will follow up with their current PCP if and as advised. If the patient does not currently have a PCP we will assist them in obtaining one.   The patient may need specialty follow up if the  symptoms continue, in spite of conservative treatment and management, for further workup, evaluation, consultation and treatment as clinically indicated and appropriate.  Patient/parent/caregiver verbalized understanding and agreement of plan as discussed.  All questions were addressed during visit.  Please see discharge instructions below for further details of plan.  Discharge Instructions:   Discharge Instructions      Please go to [facility] to have an x-ray done of your chest.  Please go to the main entrance of the emergency room and let them know that you were seen here at urgent care and that you have an x-ray ordered.  They attempted to admit you to the emergency room or tell you to sit and wait in the lobby with other patients, please call us back here at the number listed on your checkout sheet, [phone] and we will speak with them to get you in quickly.  Once we receive the results of her chest x-ray, we we will place them to your MyChart account.  If there are any findings that are different and what I anticipate, I will contact you by phone.   At this time, based on my physical exam findings and the history you have provided to me today, I believe that you have pneumonia in your right lower lung field.  I recommend antibiotics and supportive medications at this time.  Please read below to learn more about the medications, dosages and frequencies that I recommend to help alleviate your symptoms and to get you feeling better soon:   Augmentin (amoxicillin - clavulanic acid): This is an antibiotic for pneumonia.  Please take 1 tablet twice daily for 5 days, you can take  it with or without food.  This antibiotic can cause upset stomach, this will resolve once antibiotics are complete.  You are welcome to use a probiotic, eat yogurt, take Imodium while taking this medication.  Please avoid other systemic medications such as Maalox, Pepto-Bismol or  milk of magnesia as they can interfere with your body's ability to absorb the antibiotics.  Z-Pak (azithromycin): This is a second metabolic for pneumonia, please take 2 tablets the first day, then take 1 tablet daily every day thereafter until complete.   Zyrtec (cetirizine): This is an excellent second-generation antihistamine that helps to reduce respiratory inflammatory response to environmental allergens.  In some patients, this medication can cause daytime sleepiness so I recommend that you take 1 tablet daily at bedtime.     Flonase (fluticasone): This is a steroid nasal spray that you use once daily, 1 spray in each nare.  This medication does not work well if you decide to use it only used as you feel you need to, it works best used on a daily basis.  After 3 to 5 days of use, you will notice significant reduction of the inflammation and mucus production that is currently being caused by exposure to allergens, whether seasonal or environmental.  The most common side effect of this medication is nosebleeds.  If you experience a nosebleed, please discontinue use for 1 week, then feel free to resume.     ProAir, Ventolin, Proventil (albuterol): This inhaled medication contains a short acting beta agonist bronchodilator.  This medication works on the smooth muscle that opens and constricts of your airways by relaxing the muscle.  The result of relaxation of the smooth muscle is increased air movement and improved work of breathing.  This is a short acting medication that can be used every 4-6 hours as needed for increased work of breathing, shortness of breath, wheezing and excessive coughing.  I have provided you with a prescription in addition to a sample to use until you can pick up your prescription.    Advil, Motrin (ibuprofen): This is a good anti-inflammatory medication which not only addresses aches, pains but also significantly reduces soft tissue inflammation of the upper airways that causes  sinus and nasal congestion as well as inflammation of the lower airways which makes you feel like your breathing is constricted or your cough feel tight.  I recommend that you take 400 mg every 8 hours as needed.      Robitussin, Mucinex (guaifenesin): This is an expectorant.  This helps break up chest congestion and loosen up thick nasal drainage making phlegm and drainage more liquid and therefore easier to remove.  I recommend being 400 mg three times daily as needed.      Promethazine DM: Promethazine is both a nasal decongestant and an antinausea medication that makes most patients feel fairly sleepy.  The DM is dextromethorphan, a cough suppressant found in many over-the-counter cough medications.  Please take 5 mL before bedtime to minimize your cough which will help you sleep better.     Please follow-up within the next 5-7 days either with your primary care provider or urgent care if your symptoms do not resolve.  If you do not have a primary care provider, we will assist you in finding one.        Thank you for visiting urgent care today.  We appreciate the opportunity to participate in your care.       This office note has been dictated using Dragon  speech recognition software.  Unfortunately, this method of dictation can sometimes lead to typographical or grammatical errors.  I apologize for your inconvenience in advance if this occurs.  Please do not hesitate to reach out to me if clarification is needed.      Theadora Rama Scales, PA-C 02/24/22 1829

## 2022-02-24 NOTE — ED Triage Notes (Signed)
Pt c/o fever since Tuesday, chills, cough, and weakness.  Home interventions: motrin

## 2022-02-26 ENCOUNTER — Emergency Department (HOSPITAL_COMMUNITY)
Admission: EM | Admit: 2022-02-26 | Discharge: 2022-02-26 | Disposition: A | Discharge status: Home / Self Care | Payer: Medicaid Other | Attending: Emergency Medicine | Admitting: Emergency Medicine

## 2022-02-26 ENCOUNTER — Other Ambulatory Visit: Payer: Self-pay

## 2022-02-26 ENCOUNTER — Emergency Department (HOSPITAL_COMMUNITY): Payer: Medicaid Other

## 2022-02-26 DIAGNOSIS — J101 Influenza due to other identified influenza virus with other respiratory manifestations: Secondary | ICD-10-CM | POA: Insufficient documentation

## 2022-02-26 DIAGNOSIS — Z1152 Encounter for screening for COVID-19: Secondary | ICD-10-CM | POA: Insufficient documentation

## 2022-02-26 DIAGNOSIS — J45909 Unspecified asthma, uncomplicated: Secondary | ICD-10-CM | POA: Insufficient documentation

## 2022-02-26 DIAGNOSIS — R531 Weakness: Secondary | ICD-10-CM | POA: Diagnosis not present

## 2022-02-26 DIAGNOSIS — R059 Cough, unspecified: Secondary | ICD-10-CM | POA: Diagnosis not present

## 2022-02-26 DIAGNOSIS — J111 Influenza due to unidentified influenza virus with other respiratory manifestations: Secondary | ICD-10-CM

## 2022-02-26 LAB — RESP PANEL BY RT-PCR (RSV, FLU A&B, COVID)  RVPGX2
Influenza A by PCR: NEGATIVE
Influenza B by PCR: POSITIVE — AB
Resp Syncytial Virus by PCR: NEGATIVE
SARS Coronavirus 2 by RT PCR: NEGATIVE

## 2022-02-26 LAB — COMPREHENSIVE METABOLIC PANEL
ALT: 23 U/L (ref 0–44)
AST: 45 U/L — ABNORMAL HIGH (ref 15–41)
Albumin: 4.5 g/dL (ref 3.5–5.0)
Alkaline Phosphatase: 43 U/L (ref 38–126)
Anion gap: 13 (ref 5–15)
BUN: 11 mg/dL (ref 6–20)
CO2: 24 mmol/L (ref 22–32)
Calcium: 9.6 mg/dL (ref 8.9–10.3)
Chloride: 97 mmol/L — ABNORMAL LOW (ref 98–111)
Creatinine, Ser: 1.12 mg/dL (ref 0.61–1.24)
GFR, Estimated: >60 mL/min (ref >60)
Glucose, Bld: 101 mg/dL — ABNORMAL HIGH (ref 70–99)
Potassium: 4.7 mmol/L (ref 3.5–5.1)
Sodium: 134 mmol/L — ABNORMAL LOW (ref 135–145)
Total Bilirubin: 0.5 mg/dL (ref 0.3–1.2)
Total Protein: 8.1 g/dL (ref 6.5–8.1)

## 2022-02-26 LAB — CBC WITH DIFFERENTIAL/PLATELET
Abs Immature Granulocytes: 0.01 10*3/uL (ref 0.00–0.07)
Basophils Absolute: 0 10*3/uL (ref 0.0–0.1)
Basophils Relative: 1 %
Eosinophils Absolute: 0 10*3/uL (ref 0.0–0.5)
Eosinophils Relative: 0 %
HCT: 45.3 % (ref 39.0–52.0)
Hemoglobin: 15 g/dL (ref 13.0–17.0)
Immature Granulocytes: 0 %
Lymphocytes Relative: 55 %
Lymphs Abs: 1.6 10*3/uL (ref 0.7–4.0)
MCH: 25.2 pg — ABNORMAL LOW (ref 26.0–34.0)
MCHC: 33.1 g/dL (ref 30.0–36.0)
MCV: 76.1 fL — ABNORMAL LOW (ref 80.0–100.0)
Monocytes Absolute: 0.3 10*3/uL (ref 0.1–1.0)
Monocytes Relative: 10 %
Neutro Abs: 1 10*3/uL — ABNORMAL LOW (ref 1.7–7.7)
Neutrophils Relative %: 34 %
Platelets: 145 10*3/uL — ABNORMAL LOW (ref 150–400)
RBC: 5.95 MIL/uL — ABNORMAL HIGH (ref 4.22–5.81)
RDW: 15 % (ref 11.5–15.5)
WBC: 3 10*3/uL — ABNORMAL LOW (ref 4.0–10.5)
nRBC: 0 % (ref 0.0–0.2)

## 2022-02-26 LAB — URINALYSIS, ROUTINE W REFLEX MICROSCOPIC
Bacteria, UA: NONE SEEN
Bilirubin Urine: NEGATIVE
Glucose, UA: NEGATIVE mg/dL
Hgb urine dipstick: NEGATIVE
Ketones, ur: 20 mg/dL — AB
Leukocytes,Ua: NEGATIVE
Nitrite: NEGATIVE
Protein, ur: 100 mg/dL — AB
Specific Gravity, Urine: 1.021 (ref 1.005–1.030)
pH: 5 (ref 5.0–8.0)

## 2022-02-26 LAB — LIPASE, BLOOD: Lipase: 33 U/L (ref 11–51)

## 2022-02-26 LAB — TYPE AND SCREEN
ABO/RH(D): O POS
Antibody Screen: NEGATIVE

## 2022-02-26 NOTE — ED Provider Triage Note (Addendum)
Emergency Medicine Provider Triage Evaluation Note  Andrew Jacobson , a 27 y.o. male  was evaluated in triage.  Pt complains of N/V/D, blood in stool.  Seen at [facility] 02/24/22 and diagnosed with pneumonia (despite normal CXR)-- started on augmentin and z-pack.  States since then cannot hold medications down.  This morning started to have blood in stool-- bright red in color, intermixed in stool and when wiping.  Denies abdominal pain.   Not on anticoagulation.  Review of Systems  Positive: N/V/D, blood in stool Negative: fever  Physical Exam  BP 118/84 (BP Location: Right Arm)   Pulse 80   Temp 98.7 F (37.1 C)   Resp 18   SpO2 97%   Gen:   Awake, no distress   Resp:  Normal effort  MSK:   Moves extremities without difficulty  Other:  Abdomen soft, non-tender  Medical Decision Making  Medically screening exam initiated at 5:42 AM.  Appropriate orders placed.  Andrew Jacobson was informed that the remainder of the evaluation will be completed by another provider, this initial triage assessment does not replace that evaluation, and the importance of remaining in the ED until their evaluation is complete.  N/V/D, blood in stool.  Currently on augmentin and z-pack for suspect PNA.  HD stable in triage.  Not on anticoagulation.  Abdomen soft, non-tender.  Labs ordered along with type and screen, RVP, repeat CXR.   Larene Pickett, PA-C 02/26/22 0544    Larene Pickett, PA-C 02/26/22 [phone]

## 2022-02-26 NOTE — ED Triage Notes (Signed)
Pt in with cough, congestion that remains x 5 days, currently on z-pak and azithromycin, but unable to keep it down due to n/v. Also reports he noticed blood in his stool tonight.

## 2022-02-26 NOTE — ED Provider Notes (Signed)
[facility] EMERGENCY DEPARTMENT Provider Note   CSN: [id number] Arrival date & time: 02/26/22  3557     History  Chief Complaint  Patient presents with   Cough    Andrew Jacobson is a 27 y.o. male with medical history of asthma.  The patient presents to the ED for evaluation of cough.  The patient reports that beginning on Tuesday he developed congestion, sore throat, fatigue, body aches and chills.  Patient reports that on Friday he was seen at urgent care and diagnosed with pneumonia, placed on azithromycin and amoxicillin.  The patient reports that he has been taking his medications and as result has developed diarrhea, nausea and vomiting.  Patient states that he was advised to go to outside [facility] for chest x-ray, which she did.  The patient reports that he was never given a call back to discuss results.  Patient unsure of sick contacts.  Patient denies chest pain or shortness of breath.  Patient denies any abdominal pain.  The patient is also reporting 1 episode of blood in his stool while waiting in the emergency department.  The patient denies any rectal pain, abdominal pain.  Patient denies blood thinners.  Patient denies lightheadedness, dizziness, weakness.   Cough Associated symptoms: fever and sore throat   Associated symptoms: no chest pain and no shortness of breath        Home Medications Prior to Admission medications   Medication Sig Start Date End Date Taking? Authorizing Provider  albuterol (VENTOLIN HFA) 108 (90 Base) MCG/ACT inhaler Inhale 2 puffs into the lungs every 4 (four) hours as needed for wheezing or shortness of breath (excessive coughing). 02/24/22 03/26/22  Lynden Oxford Scales, PA-C  amoxicillin-clavulanate (AUGMENTIN) 875-125 MG tablet Take 1 tablet by mouth 2 (two) times daily for 5 days. 02/24/22 03/01/22  Lynden Oxford Scales, PA-C  azithromycin (ZITHROMAX) 250 MG tablet Take 2 tablets (500 mg total) by mouth daily for 1  day, THEN 1 tablet (250 mg total) daily for 4 days. 02/24/22 02/28/22  Lynden Oxford Scales, PA-C  cetirizine (ZYRTEC ALLERGY) 10 MG tablet Take 1 tablet (10 mg total) by mouth at bedtime. 02/24/22 08/23/22  Lynden Oxford Scales, PA-C  fluticasone (FLONASE) 50 MCG/ACT nasal spray Place 1 spray into both nostrils daily. 02/24/22   Lynden Oxford Scales, PA-C  guaifenesin (HUMIBID E) 400 MG TABS tablet Take 1 tablet 3 times daily as needed for chest congestion and cough 02/24/22   Lynden Oxford Scales, PA-C  promethazine-dextromethorphan (PROMETHAZINE-DM) 6.25-15 MG/5ML syrup Take 5 mLs by mouth at bedtime as needed for cough. 02/24/22   Lynden Oxford Scales, PA-C      Allergies    Patient has no known allergies.    Review of Systems   Review of Systems  Constitutional:  Positive for fever.  HENT:  Positive for sore throat. Negative for trouble swallowing.   Respiratory:  Positive for cough. Negative for shortness of breath.   Cardiovascular:  Negative for chest pain.  Gastrointestinal:  Positive for diarrhea, nausea and vomiting.  Neurological:  Negative for dizziness, weakness and light-headedness.  All other systems reviewed and are negative.   Physical Exam Updated Vital Signs BP (!) 149/71 (BP Location: Right Arm)   Pulse 73   Temp 98.5 F (36.9 C) (Oral)   Resp 17   Wt 74.8 kg   SpO2 99%   BMI 19.07 kg/m  Physical Exam Vitals and nursing note reviewed.  Constitutional:      General:  He is not in acute distress.    Appearance: Normal appearance. He is not ill-appearing, toxic-appearing or diaphoretic.  HENT:     Head: Normocephalic and atraumatic.     Nose: Nose normal. No congestion.     Mouth/Throat:     Mouth: Mucous membranes are moist.     Pharynx: Oropharynx is clear. Uvula midline. Posterior oropharyngeal erythema present. No oropharyngeal exudate.     Tonsils: No tonsillar abscesses.  Eyes:     Extraocular Movements: Extraocular movements intact.      Conjunctiva/sclera: Conjunctivae normal.     Pupils: Pupils are equal, round, and reactive to light.  Cardiovascular:     Rate and Rhythm: Normal rate and regular rhythm.  Pulmonary:     Effort: Pulmonary effort is normal.     Breath sounds: Normal breath sounds. No wheezing.  Abdominal:     General: Abdomen is flat. Bowel sounds are normal.     Palpations: Abdomen is soft.     Tenderness: There is no abdominal tenderness.  Musculoskeletal:     Cervical back: Normal range of motion and neck supple. No tenderness.  Skin:    General: Skin is warm and dry.     Capillary Refill: Capillary refill takes less than 2 seconds.  Neurological:     Mental Status: He is alert and oriented to person, place, and time.     ED Results / Procedures / Treatments   Labs (all labs ordered are listed, but only abnormal results are displayed) Labs Reviewed  RESP PANEL BY RT-PCR (RSV, FLU A&B, COVID)  RVPGX2 - Abnormal; Notable for the following components:      Result Value   Influenza B by PCR POSITIVE (*)    All other components within normal limits  CBC WITH DIFFERENTIAL/PLATELET - Abnormal; Notable for the following components:   WBC 3.0 (*)    RBC 5.95 (*)    MCV 76.1 (*)    MCH 25.2 (*)    Platelets 145 (*)    Neutro Abs 1.0 (*)    All other components within normal limits  COMPREHENSIVE METABOLIC PANEL - Abnormal; Notable for the following components:   Sodium 134 (*)    Chloride 97 (*)    Glucose, Bld 101 (*)    AST 45 (*)    All other components within normal limits  URINALYSIS, ROUTINE W REFLEX MICROSCOPIC - Abnormal; Notable for the following components:   APPearance HAZY (*)    Ketones, ur 20 (*)    Protein, ur 100 (*)    All other components within normal limits  LIPASE, BLOOD  TYPE AND SCREEN    EKG None  Radiology DG Chest 2 View  Result Date: 02/26/2022 CLINICAL DATA:  Coughing and weakness. EXAM: CHEST - 2 VIEW COMPARISON:  Recent PA and lateral 02/24/22 FINDINGS:  The heart size and mediastinal contours are within normal limits. Both lungs are hyperinflated but clear. The visualized skeletal structures are unremarkable apart from mild upper thoracic levoscoliosis. IMPRESSION: No acute radiographic chest findings.  Stable hyperinflated chest. Electronically Signed   By: Almira Bar M.D.   On: 02/26/2022 06:43   DG Chest 2 View  Result Date: 02/24/2022 CLINICAL DATA:  Rhonchi rales fever EXAM: CHEST - 2 VIEW COMPARISON:  01/11/2018 FINDINGS: The heart size and mediastinal contours are within normal limits. Both lungs are clear. Minimal scoliosis IMPRESSION: No active cardiopulmonary disease. Electronically Signed   By: Jasmine Pang M.D.   On: 02/24/2022 19:45  Procedures Procedures   Medications Ordered in ED Medications - No data to display  ED Course/ Medical Decision Making/ A&P1}                          Medical Decision Making  27 year old no presents to ED for evaluation.  Please see HPI for further details.  On examination the patient is afebrile, nontachycardic.  The patient lung sounds are clear bilaterally, he is not hypoxic.  The patient abdomen is soft and compressible throughout.  The patient posterior oropharynx is erythematous however no exudate.  The patient uvula is midline, he is handling secretions appropriately, he is not drooling.  Patient neurological examination shows no focal neurodeficits.  No CVA tenderness bilaterally.  Patient workup initiated in triage includes CMP, CBC, lipase, urinalysis, respiratory panel, chest x-ray.  Patient CBC unremarkable without leukocytosis or anemia, hemoglobin stable at 15.  Patient CMP with slightly decreased sodium to 134.  The patient lipase is not elevated.  The patient urinalysis is unremarkable.  The patient respiratory panel was positive for influenza B.  Patient chest x-ray is unremarkable without any signs of consolidations or effusions.  Results of patient testing were discussed  with the patient.  Doubt pneumonia.  Patient most likely suffering from symptoms of influenza.  Patient advised to stop taking antibiotics as he states that they are causing nausea, vomiting and diarrhea.  Patient offered rectal exam to confirm blood positive stool however he deferred at this time.  I have advised the patient that if he continues to have blood in his stool, or if he becomes symptomatic he will need to return to the ED for further management.  Patient has opted in the interim to be referred to PCP for further management.  Patient voiced understanding with return precautions.  The patient had all of his questions answered to his satisfaction.  Patient stable for discharge.  Final Clinical Impression(s) / ED Diagnoses Final diagnoses:  Influenza    Rx / DC Orders ED Discharge Orders     None         Lawana Chambers 02/26/22 0853    Hayden Rasmussen, MD 02/26/22 1731

## 2022-02-26 NOTE — Discharge Instructions (Addendum)
Return to the ED with any new or worsening symptoms.  Please return to the ED if you continue to have blood in your stool. Please follow-up with PCP I referred you to.  Please call and make an appointment to be seen. Please discontinue use of antibiotics as your chest x-ray was clear here today and you have no signs of pneumonia Please read attached guide concerning influenza. Please treat symptoms conservatively at home.  Please use ibuprofen and Tylenol for body aches and chills.  Please use Zicam for short duration of cold symptoms.  You may also eat a low fat high-protein diet.  Please supplement with electrolyte beverages. Please see attached work note

## 2022-02-28 ENCOUNTER — Telehealth: Payer: Self-pay | Admitting: *Deleted

## 2022-02-28 NOTE — Patient Outreach (Signed)
  Care Coordination [facility] Note Transition Care Management Unsuccessful Follow-up Telephone Call  Date of discharge and from where:  02/26/22 from [facility]  Attempts:  1st Attempt  Reason for unsuccessful TCM follow-up call:  Left voice message   Lurena Joiner RN, BSN [facility] RN Care Coordinator
